# Patient Record
Sex: Male | Born: 1995 | Race: Black or African American | Hispanic: No | Marital: Single | Smoking: Never smoker
Health system: Southern US, Community
[De-identification: ages and names within clinical notes are randomized; demographics above are authoritative.]

## PROBLEM LIST (undated history)

## (undated) DIAGNOSIS — F32A Depression, unspecified: Secondary | ICD-10-CM

## (undated) DIAGNOSIS — F329 Major depressive disorder, single episode, unspecified: Secondary | ICD-10-CM

## (undated) DIAGNOSIS — F909 Attention-deficit hyperactivity disorder, unspecified type: Secondary | ICD-10-CM

## (undated) DIAGNOSIS — J45909 Unspecified asthma, uncomplicated: Secondary | ICD-10-CM

---
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
## 2016-02-23 ENCOUNTER — Emergency Department
Admission: EM | Admit: 2016-02-23 | Discharge: 2016-02-23 | Disposition: A | Discharge status: Home / Self Care | Attending: Emergency Medicine | Admitting: Emergency Medicine

## 2016-02-23 DIAGNOSIS — F1290 Cannabis use, unspecified, uncomplicated: Secondary | ICD-10-CM | POA: Diagnosis not present

## 2016-02-23 DIAGNOSIS — Y939 Activity, unspecified: Secondary | ICD-10-CM | POA: Insufficient documentation

## 2016-02-23 DIAGNOSIS — J45909 Unspecified asthma, uncomplicated: Secondary | ICD-10-CM | POA: Diagnosis not present

## 2016-02-23 DIAGNOSIS — W25XXXA Contact with sharp glass, initial encounter: Secondary | ICD-10-CM | POA: Insufficient documentation

## 2016-02-23 DIAGNOSIS — Y999 Unspecified external cause status: Secondary | ICD-10-CM | POA: Diagnosis not present

## 2016-02-23 DIAGNOSIS — S51811A Laceration without foreign body of right forearm, initial encounter: Secondary | ICD-10-CM | POA: Diagnosis not present

## 2016-02-23 DIAGNOSIS — Y929 Unspecified place or not applicable: Secondary | ICD-10-CM | POA: Diagnosis not present

## 2016-02-23 HISTORY — DX: Unspecified asthma, uncomplicated: J45.909

## 2016-02-23 MED ORDER — PENTAFLUOROPROP-TETRAFLUOROETH EX AERO: INHALATION_SPRAY | Freq: Once | CUTANEOUS | Status: DC

## 2016-02-23 MED ORDER — CEPHALEXIN 500 MG PO CAPS
500.0000 mg | ORAL_CAPSULE | Freq: Four times a day (QID) | ORAL | Status: DC
Start: 2016-02-23 — End: 2022-07-17

## 2016-02-23 NOTE — ED Notes (Signed)
Pt punched a glass window and it shattered down and cut his arm.  This happened last night.  Pt has scattered small lacerations to R fist and larger laceration in R arm.

## 2016-02-23 NOTE — Discharge Instructions (Signed)

## 2016-02-23 NOTE — ED Notes (Signed)
Lac to right forearm with glass, happened yesterday

## 2016-02-23 NOTE — ED Provider Notes (Signed)
[facility] Emergency Department Provider Note  ____________________________________________  Time seen: Approximately 8:08 PM  I have reviewed the triage vital signs and the nursing notes.   HISTORY  Chief Complaint Extremity Laceration    HPI Edwin Acevedo is a 20 y.o. male who presents emergency department for a laceration to the right forearm. Patient states that this occurred 24 hours plus ago. Patient states that he wrapped the area and was able control bleeding. He is current on his tetanus shot. Patient states that he had a glass window that shattered and fell on his arm. Patient denies any visible foreign bodies. Denies any bleeding at this time. He denies any numbness or tingling distal to the injury site.   Past Medical History  Diagnosis Date  . Asthma     There are no active problems to display for this patient.   History reviewed. No pertinent past surgical history.  Current Outpatient Rx  Name  Route  Sig  Dispense  Refill  . cephALEXin (KEFLEX) 500 MG capsule   Oral   Take 1 capsule (500 mg total) by mouth 4 (four) times daily.   28 capsule   0     Allergies Review of patient's allergies indicates no known allergies.  History reviewed. No pertinent family history.  Social History Social History  Substance Use Topics  . Smoking status: Never Smoker   . Smokeless tobacco: None  . Alcohol Use: No     Review of Systems  Constitutional: No fever/chills Cardiovascular: no chest pain. Respiratory: no cough. No SOB. Musculoskeletal: Negative for musculoskeletal pain. Skin: Positive for lacerations to the right forearm Neurological: Negative for headaches, focal weakness or numbness. 10-point ROS otherwise negative.  ____________________________________________   PHYSICAL EXAM:  VITAL SIGNS: ED Triage Vitals  Enc Vitals Group     BP --      Pulse --      Resp --      Temp --      Temp src --      SpO2 --       Weight --      Height --      Head Cir --      Peak Flow --      Pain Score 02/23/16 1954 10     Pain Loc --      Pain Edu? --      Excl. in GC? --      Constitutional: Alert and oriented. Well appearing and in no acute distress. Eyes: Conjunctivae are normal. PERRL. EOMI. Head: Atraumatic. Cardiovascular: Normal rate, regular rhythm. Normal S1 and S2.  Good peripheral circulation. Respiratory: Normal respiratory effort without tachypnea or retractions. Lungs CTAB. Good air entry to the bases with no decreased or absent breath sounds. Musculoskeletal: Full range of motion to all extremities. No gross deformities appreciated. Neurologic:  Normal speech and language. No gross focal neurologic deficits are appreciated.  Skin:  Skin is warm, dry and intact. No rash noted. Patient has multiple lacerations noted to the right anterior forearm. There is one significant laceration extending approximately 6 cm in length. Edges are smooth by gait open. Multiple small surrounding abrasions and small lacerations. These are nonbleeding. There is granulation tissue identified in the edges of the wound and on top of the abrasion. No visible foreign body with glass. Patient has good range of motion distal to injury. Good resisted range of motion. Radial pulse and cap refill intact distally. Sensation intact 5 digits  right upper extremity. Psychiatric: Mood and affect are normal. Speech and behavior are normal. Patient exhibits appropriate insight and judgement.   ____________________________________________   LABS (all labs ordered are listed, but only abnormal results are displayed)  Labs Reviewed - No data to display ____________________________________________  EKG   ____________________________________________  RADIOLOGY   No results found.  ____________________________________________    PROCEDURES  Procedure(s) performed:    The wound is cleansed, debrided of foreign material  as much as possible, and dressed. The patient is alerted to watch for any signs of infection (redness, pus, pain, increased swelling or fever) and call if such occurs. Home wound care instructions are provided. Tetanus vaccination status reviewed: tetanus re-vaccination not indicated.   Medications  pentafluoroprop-tetrafluoroeth (GEBAUERS) aerosol (not administered)     ____________________________________________   INITIAL IMPRESSION / ASSESSMENT AND PLAN / ED COURSE  Pertinent labs & imaging results that were available during my care of the patient were reviewed by me and considered in my medical decision making (see chart for details).  Patient's diagnosis is consistent with A laceration to the right forearm. This occurred gr 24 hours ago after the patient punched a glass window. There is no visible foreign bodies. There is signs of granulation tissue surrounding the most significant laceration. As such, with this occurring greater than 24 hours ago with signs of granulation that will not be closed primarily in the emergency department. This is explained to the patient and the mother. Patient verbalizes understanding but patient's mother is verbally upset that the provider will not close the wound. I have explained to the mother the complications and swelling from closure of the wound that area shows tissue granulation. Mother remains upset with the provider throughout the encounter. Area is cleansed using surgical scrub after being anesthetized using topical anesthetic. Area is then covered using nonadherent dressing covered with gauze and wrapped with Ace bandage. Wound care instructions are given to the patient. Patient will be placed prophylactically on antibiotics due to nature of multiple lacerations that have remained open and untreated greater than 24 hours.. Patient will be discharged home with prescriptions for antibiotics. Patient is to follow up with primary care provider as needed or  otherwise directed. Patient is given ED precautions to return to the ED for any worsening or new symptoms.     ____________________________________________  FINAL CLINICAL IMPRESSION(S) / ED DIAGNOSES  Final diagnoses:  Laceration of forearm, right, initial encounter      NEW MEDICATIONS STARTED DURING THIS VISIT:  New Prescriptions   CEPHALEXIN (KEFLEX) 500 MG CAPSULE    Take 1 capsule (500 mg total) by mouth 4 (four) times daily.        This chart was dictated using voice recognition software/Dragon. Despite best efforts to proofread, errors can occur which can change the meaning. Any change was purely unintentional.    Racheal Patches, PA-C 02/23/16 2039

## 2016-02-23 NOTE — ED Notes (Signed)
Pt discharged to home.  Family member driving.  Discharge instructions reviewed.  Verbalized understanding.  No questions or concerns at this time.  Teach back verified.  Pt in NAD.  No items left in ED.   

## 2017-07-09 ENCOUNTER — Emergency Department
Admission: EM | Admit: 2017-07-09 | Discharge: 2017-07-09 | Disposition: A | Discharge status: Home / Self Care | Attending: Emergency Medicine | Admitting: Emergency Medicine

## 2017-07-09 ENCOUNTER — Encounter: Payer: Self-pay | Admitting: Emergency Medicine

## 2017-07-09 ENCOUNTER — Emergency Department

## 2017-07-09 DIAGNOSIS — N50819 Testicular pain, unspecified: Secondary | ICD-10-CM | POA: Insufficient documentation

## 2017-07-09 DIAGNOSIS — N50812 Left testicular pain: Secondary | ICD-10-CM

## 2017-07-09 DIAGNOSIS — J45909 Unspecified asthma, uncomplicated: Secondary | ICD-10-CM | POA: Insufficient documentation

## 2017-07-09 HISTORY — DX: Attention-deficit hyperactivity disorder, unspecified type: F90.9

## 2017-07-09 HISTORY — DX: Depression, unspecified: F32.A

## 2017-07-09 HISTORY — DX: Major depressive disorder, single episode, unspecified: F32.9

## 2017-07-09 LAB — COMPREHENSIVE METABOLIC PANEL
ALK PHOS: 26 U/L — AB (ref 38–126)
ALT: 34 U/L (ref 17–63)
ANION GAP: 11 (ref 5–15)
AST: 30 U/L (ref 15–41)
Albumin: 4.6 g/dL (ref 3.5–5.0)
BUN: 16 mg/dL (ref 6–20)
CALCIUM: 9.6 mg/dL (ref 8.9–10.3)
CO2: 25 mmol/L (ref 22–32)
Chloride: 104 mmol/L (ref 101–111)
Creatinine, Ser: 1.25 mg/dL — ABNORMAL HIGH (ref 0.61–1.24)
GFR calc non Af Amer: >60 mL/min (ref >60)
Glucose, Bld: 114 mg/dL — ABNORMAL HIGH (ref 65–99)
Potassium: 3.6 mmol/L (ref 3.5–5.1)
SODIUM: 140 mmol/L (ref 135–145)
TOTAL PROTEIN: 7.6 g/dL (ref 6.5–8.1)
Total Bilirubin: 1.2 mg/dL (ref 0.3–1.2)

## 2017-07-09 LAB — URINALYSIS, COMPLETE (UACMP) WITH MICROSCOPIC
Bilirubin Urine: NEGATIVE
Glucose, UA: NEGATIVE mg/dL
Hgb urine dipstick: NEGATIVE
Ketones, ur: NEGATIVE mg/dL
Leukocytes, UA: NEGATIVE
Nitrite: NEGATIVE
Protein, ur: 100 mg/dL — AB
Specific Gravity, Urine: 1.015 (ref 1.005–1.030)
pH: 8 (ref 5.0–8.0)

## 2017-07-09 LAB — CBC WITH DIFFERENTIAL/PLATELET
BASOS ABS: 0 10*3/uL (ref 0–0.1)
BASOS PCT: 0 %
Eosinophils Absolute: 0.2 10*3/uL (ref 0–0.7)
Eosinophils Relative: 3 %
HEMATOCRIT: 41.2 % (ref 40.0–52.0)
HEMOGLOBIN: 14.5 g/dL (ref 13.0–18.0)
Lymphocytes Relative: 25 %
Lymphs Abs: 1.6 10*3/uL (ref 1.0–3.6)
MCH: 28.6 pg (ref 26.0–34.0)
MCHC: 35.2 g/dL (ref 32.0–36.0)
MCV: 81.2 fL (ref 80.0–100.0)
MONOS PCT: 6 %
Monocytes Absolute: 0.4 10*3/uL (ref 0.2–1.0)
NEUTROS PCT: 66 %
Neutro Abs: 4.2 10*3/uL (ref 1.4–6.5)
Platelets: 229 10*3/uL (ref 150–440)
RBC: 5.07 MIL/uL (ref 4.40–5.90)
RDW: 13.1 % (ref 11.5–14.5)
WBC: 6.4 10*3/uL (ref 3.8–10.6)

## 2017-07-09 LAB — CHLAMYDIA/NGC RT PCR (ARMC ONLY)
Chlamydia Tr: NOT DETECTED
N gonorrhoeae: NOT DETECTED

## 2017-07-09 MED ORDER — LEVOFLOXACIN 500 MG PO TABS: 500.0000 mg | ORAL_TABLET | Freq: Every day | ORAL | 0 refills | Status: DC

## 2017-07-09 MED ORDER — HYDROMORPHONE HCL 1 MG/ML IJ SOLN
1.0000 mg | Freq: Once | INTRAMUSCULAR | Status: AC
  Administered 2017-07-09: 1 mg via INTRAVENOUS
  Filled 2017-07-09: qty 1

## 2017-07-09 MED ORDER — DEXTROSE 5 % IV SOLN
250.0000 mg | Freq: Once | INTRAVENOUS | Status: AC
  Administered 2017-07-09: 250 mg via INTRAVENOUS
  Filled 2017-07-09: qty 250

## 2017-07-09 MED ORDER — OXYCODONE-ACETAMINOPHEN 5-325 MG PO TABS: 1.0000 | ORAL_TABLET | Freq: Four times a day (QID) | ORAL | 0 refills | Status: AC | PRN

## 2017-07-09 MED ORDER — AZITHROMYCIN 500 MG PO TABS
1000.0000 mg | ORAL_TABLET | Freq: Once | ORAL | Status: AC
  Administered 2017-07-09: 1000 mg via ORAL
  Filled 2017-07-09: qty 2

## 2017-07-09 NOTE — ED Triage Notes (Signed)
Patient to ED in custody of [facility]. Patient reports left testicular pain that started this morning when he woke up to use the restroom. Patient denies injury or history of same.

## 2017-07-09 NOTE — ED Provider Notes (Addendum)
[facility]  ____________________________________________   I have reviewed the triage vital signs and the nursing notes.   HISTORY  Chief Complaint Testicle Pain    HPI Edwin Acevedo is a 21 y.o. male Who presents today complaining of left testicular pain, started suddenly woke him up from sleep he thinks perhaps at 4:00 in the morning. Patient is incarcerated. He states that he has had no trauma to the area, he denies any dysuria or antecedent urinary symptoms. He states he just woke up with severe left testicular pain. he hasnever had this before. I saw the patient immediately upon arrival to room.nothing makes it better, moving makes it worse, no antecedent signs or symptoms no antecedent treatment, pain is sharp and nonradiating.    Past Medical History:  Diagnosis Date  . ADHD   . Asthma   . Depression     There are no active problems to display for this patient.   History reviewed. No pertinent surgical history.  Prior to Admission medications   Medication Sig Start Date End Date Taking? Authorizing Provider  cephALEXin (KEFLEX) 500 MG capsule Take 1 capsule (500 mg total) by mouth 4 (four) times daily. 02/23/16   Cuthriell, Delorise Royals, PA-C    Allergies Patient has no known allergies.  No family history on file.  Social History Social History  Substance Use Topics  . Smoking status: Never Smoker  . Smokeless tobacco: Not on file  . Alcohol use No    Review of Systems Constitutional: No fever/chills Eyes: No visual changes. ENT: No sore throat. No stiff neck no neck pain Cardiovascular: Denies chest pain. Respiratory: Denies shortness of breath. Gastrointestinal:   no vomiting.  No diarrhea.  No constipation. Genitourinary: Negative for dysuria. Musculoskeletal: Negative lower extremity swelling Skin: Negative for rash. Neurological: Negative for severe headaches, focal weakness or  numbness.   ____________________________________________   PHYSICAL EXAM:  VITAL SIGNS: ED Triage Vitals [07/09/17 0735]  Enc Vitals Group     BP (!) 157/96     Pulse Rate 84     Resp (!) 24     Temp 97.8 F (36.6 C)     Temp Source Oral     SpO2 100 %     Weight 180 lb (81.6 kg)     Height  (1.778 m)     Head Circumference      Peak Flow      Pain Score 10     Pain Loc      Pain Edu?      Excl. in GC?     Constitutional: Alert and oriented. Well appearing appears very uncomfortable. Eyes: Conjunctivae are normal Head: Atraumatic HEENT: No congestion/rhinnorhea. Mucous membranes are moist.  Oropharynx non-erythematous Neck:   Nontender with no meningismus, no masses, no stridor Cardiovascular: Normal rate, regular rhythm. Grossly normal heart sounds.  Good peripheral circulation. Respiratory: Normal respiratory effort.  No retractions. Lungs CTAB. Abdominal: Soft and nontender. No distention. No guarding no rebound Back:  There is no focal tenderness or step off.  there is no midline tenderness there are no lesions noted. there is no CVA tenderness GU: There is something of a transverse lie to the left testicle, which is tender to touch no significant erythema or swelling, cremasteric reflexes diminished bilaterally, there is tenderness palpation but no significant swelling noted to the left testicular region. Penis itself is normal appearance. No obvious lesions. No masses. Testicle itself in normal  size. No significant swelling.  Musculoskeletal: No lower extremity tenderness, no upper extremity tenderness. No joint effusions, no DVT signs strong distal pulses no edema Neurologic:  Normal speech and language. No gross focal neurologic deficits are appreciated.  Skin:  Skin is warm, dry and intact. No rash noted. Psychiatric: Mood and affect are normal. Speech and behavior are normal.  ____________________________________________   LABS (all labs ordered are  listed, but only abnormal results are displayed)  Labs Reviewed  CHLAMYDIA/NGC RT PCR ([facility] ONLY)  COMPREHENSIVE METABOLIC PANEL  CBC WITH DIFFERENTIAL/PLATELET  URINALYSIS, COMPLETE ([facility]MP) WITH MICROSCOPIC    Pertinent labs  results that were available during my care of the patient were reviewed by me and considered in my medical decision making (see chart for details). ____________________________________________  EKG  I personally interpreted any EKGs ordered by me or triage  ____________________________________________  RADIOLOGY  Pertinent labs & imaging results that were available during my care of the patient were reviewed by me and considered in my medical decision making (see chart for details). If possible, patient and/or family made aware of any abnormal findings. ____________________________________________    PROCEDURES  Procedure(s) performed: None  Procedures  Critical Care performed: None  ____________________________________________   INITIAL IMPRESSION / ASSESSMENT AND PLAN / ED COURSE  Pertinent labs & imaging results that were available during my care of the patient were reviewed by me and considered in my medical decision making (see chart for details).  patient here with sudden onset testicular pain differential includes torsion, epididymitis, torsion of the appendix, STI, etc. We will give the patient pain medication. given that he is already almost 4 hours into this, by his best estimate although he is not exactly sure when this started, I have called ultrasound to see if we can have a stat testicular ultrasound performed. I also did try manual detorsion exercises which did not seem to change patient's presentation.  Clinical Course as of Jul 09 917  Tue Jul 09, 2017  1610 after pain medication patient very much more comfortable, able to do a better exam, he has minimal tenderness to palpation left testicle, the testicular lie appears to  be more normal, he has cremasteric reflexes which are now elicitable, and she is much less tender. Either the detorsion worked or there is other pathology present. We will await ultrasound results which I have called for, and patient is much more comfortable  [JM]    Clinical Course User Index [JM] Jeanmarie Plant, MD   __----------------------------------------- 9:18 AM on 07/09/2017 -----------------------------------------  Pt comfortable and slight tenderness only noted. I have paged urology.  ----------------------------------------- 9:33 AM on 07/09/2017 ----------------------------------------- D/w Dr. Mena Goes, we discussed patient's initial presentation, blood work, lab work, urinalysis, ultrasound readings, current exam, and history etc. He feels patient is safe for outpatient discharge, patient himself feels much better. I will empirically treated for possible epididymitis with Rocephin and levoflox, is possibly torsion detorsion although the absence of any evidence ofedema despite 4 hours of pain prior to ultrasound suggested this was most likely not a torsion. Urology agrees. We will send the patient home with antibiotics and pain control and close outpatient follow-up and return precautions. as he is in jail, I have also try to teach him how to do a manual detorsion should this happen again because obviously it would be somewhat difficult for him possibly to control how rapidly he comes back.  __________________________________________   FINAL CLINICAL IMPRESSION(S) / ED DIAGNOSES  Final diagnoses:  None  This chart was dictated using voice recognition software.  Despite best efforts to proofread,  errors can occur which can change meaning.      Jeanmarie Plant, MD 07/09/17 1610    Jeanmarie Plant, MD 07/09/17 9604    Jeanmarie Plant, MD 07/09/17 5409    Jeanmarie Plant, MD 07/09/17 8119    Jeanmarie Plant, MD 07/09/17 1478    Jeanmarie Plant,  MD 07/09/17 1017

## 2017-07-09 NOTE — ED Notes (Signed)
Patient taken to sheriff's car in wheelchair. Patient verbalized understanding of discharge instructions.

## 2017-07-09 NOTE — Discharge Instructions (Signed)
It is not clear exactly what caused your pain today. If you have pain that comes back, fever, vomiting, burning when you urinate return to the emergency department. If you ever have sudden onset pain that comes in one testicle it could be a sign you have twisted your testicle and you need to come right away. You may have less than four hours to get it corrected.

## 2017-07-09 NOTE — ED Notes (Signed)
Patient remains in bilateral wrist cuffs and ankle shackles. Good pulses and skin condition noted. Even respirations and no apparent distress.

## 2017-07-10 LAB — URINE CULTURE: Culture: NO GROWTH

## 2018-11-14 IMAGING — US US SCROTUM
1 series · 14 of 25 positions shown · non-contrast
Comparison: None.

CLINICAL DATA: Left-sided testicular pain since 4 a.m..

EXAM:
SCROTAL ULTRASOUND
DOPPLER ULTRASOUND OF THE TESTICLES
TECHNIQUE: Complete ultrasound examination of the testicles, epididymis, and
other scrotal structures was performed. Color and spectral Doppler
ultrasound were also utilized to evaluate blood flow to the
testicles.

[Series 1: us scrotum · 0.08mm/px · 14 of 89 slices shown]
[im 1/89]
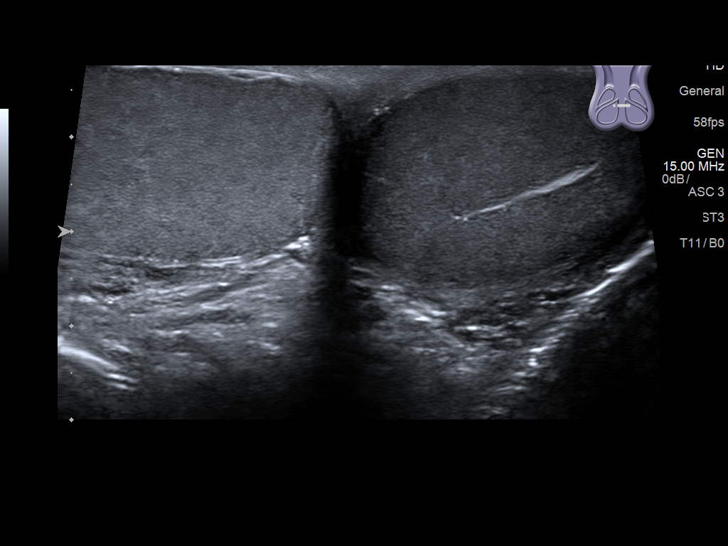
[im 8/89]
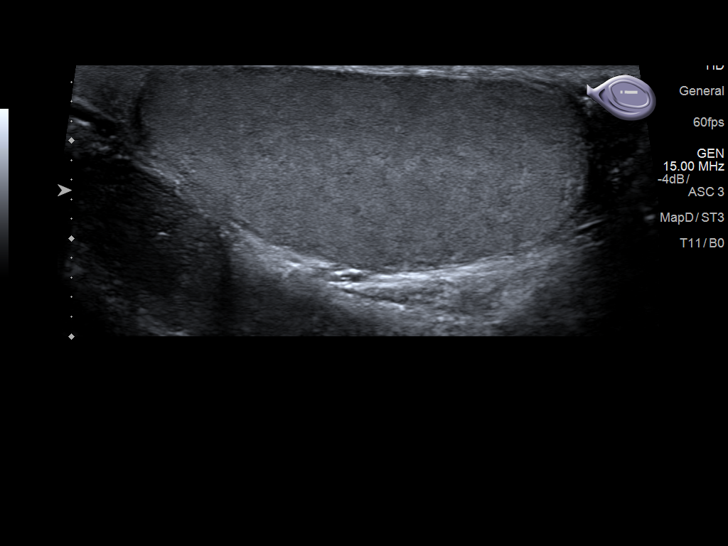
[im 15/89]
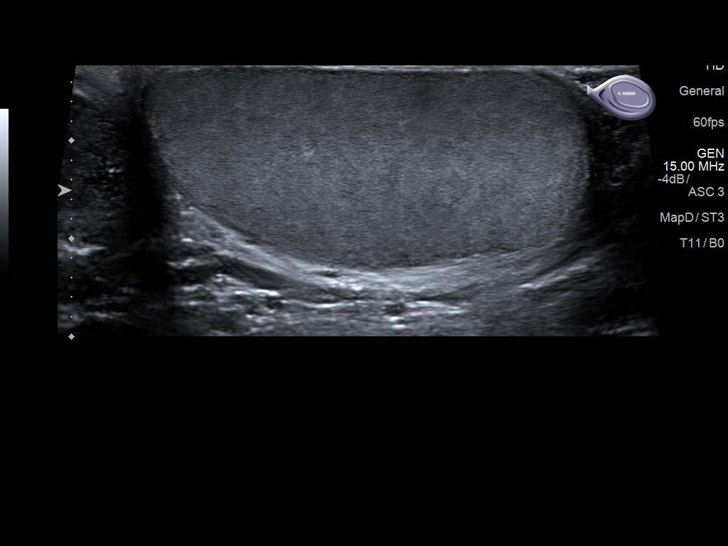
[im 23/89]
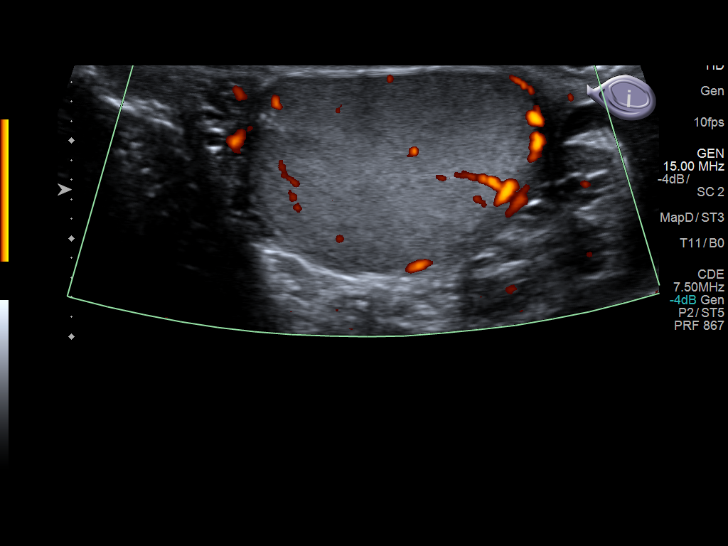
[im 30/89]
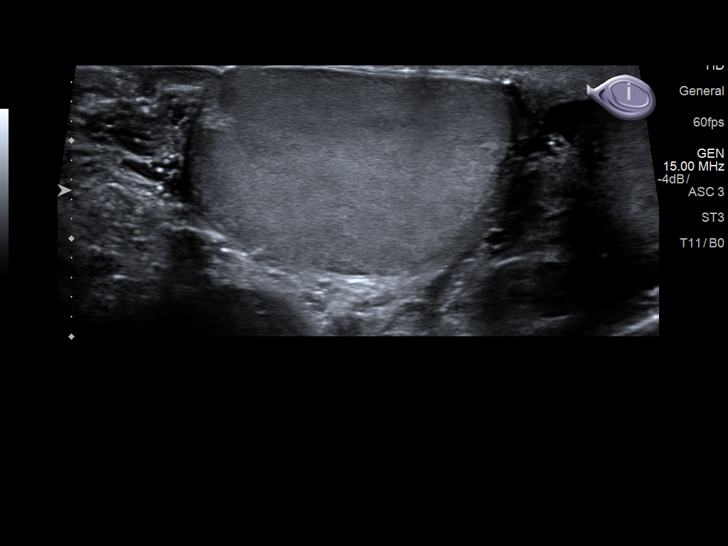
[im 34/89]
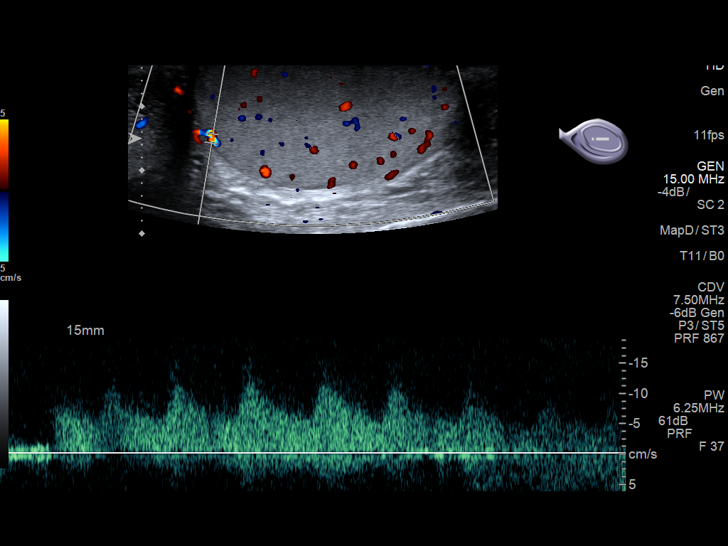
[im 41/89]
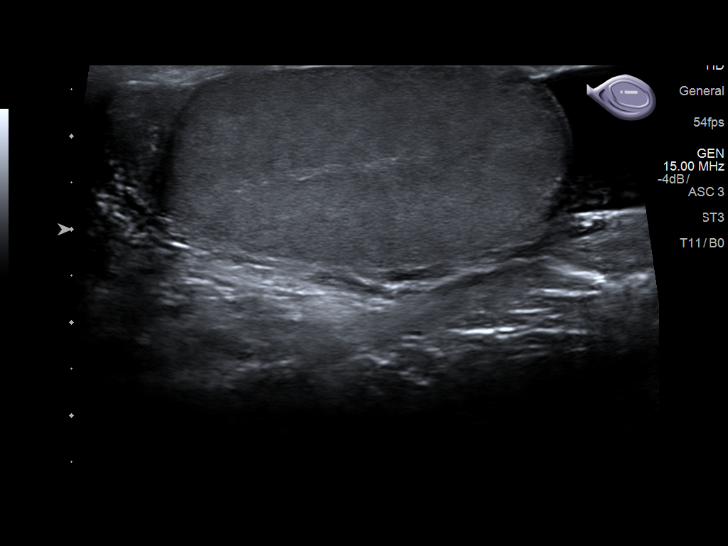
[im 48/89]
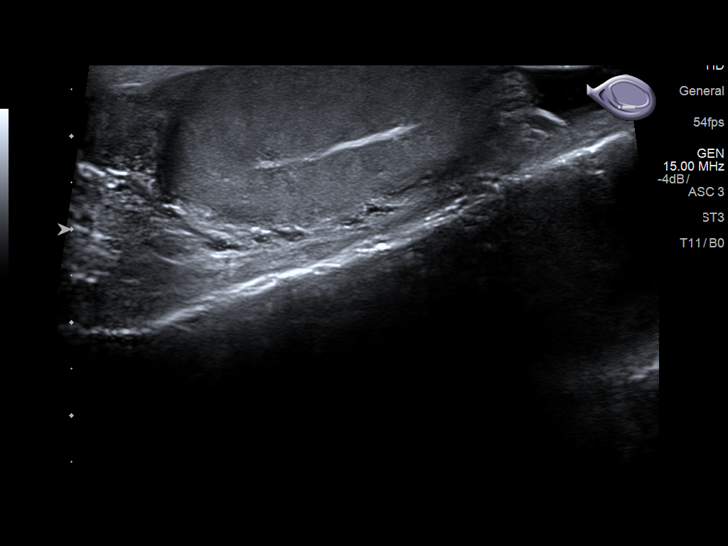
[im 56/89]
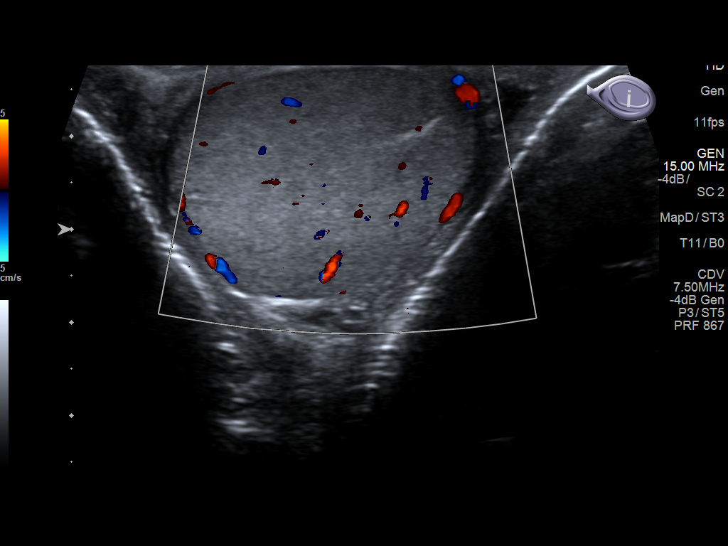
[im 59/89]
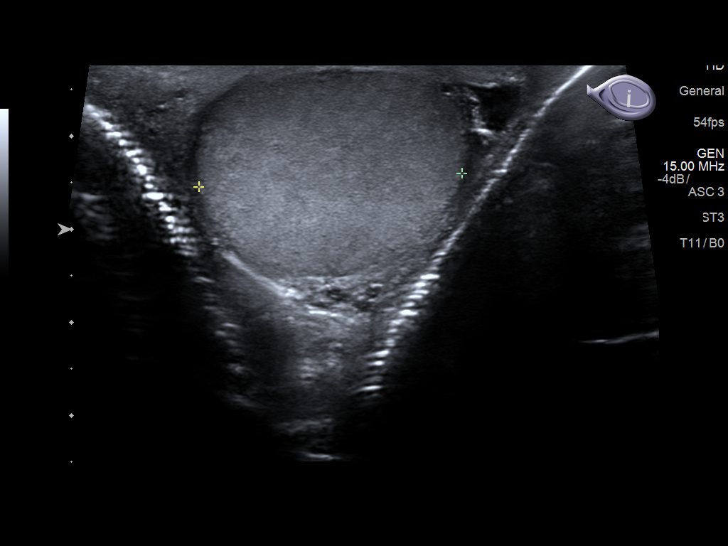
[im 67/89]
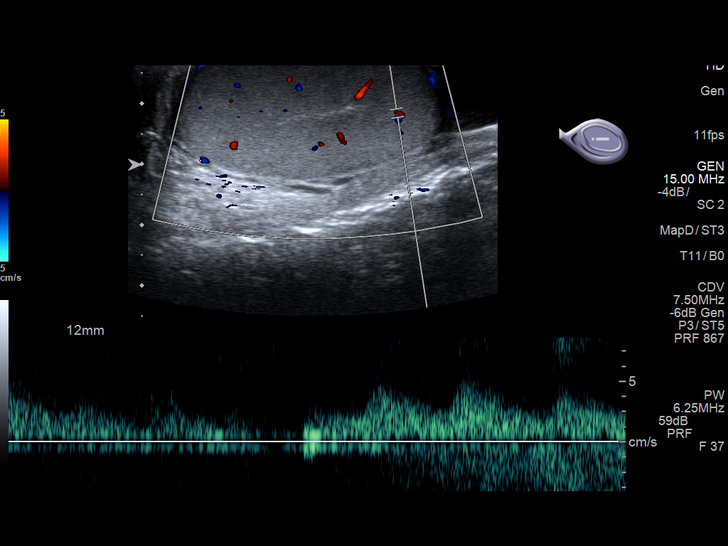
[im 74/89]
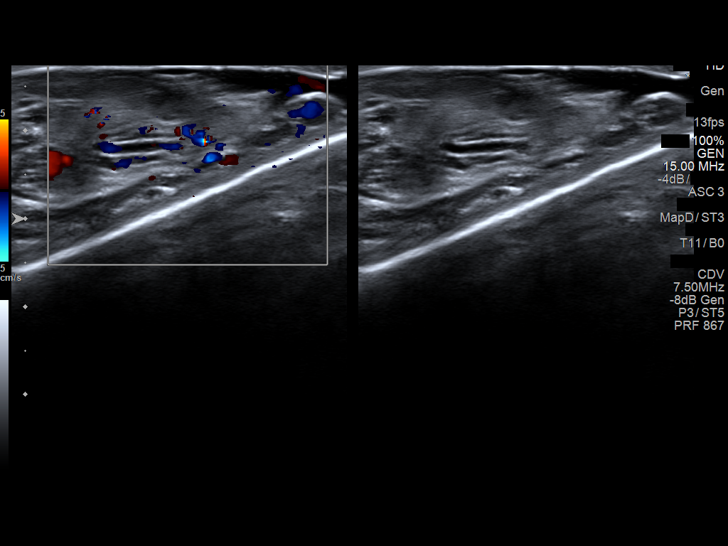
[im 81/89]
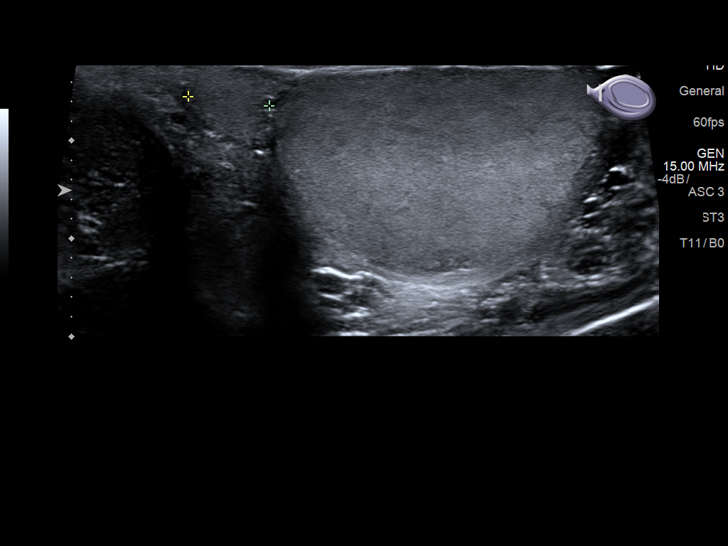
[im 89/89]
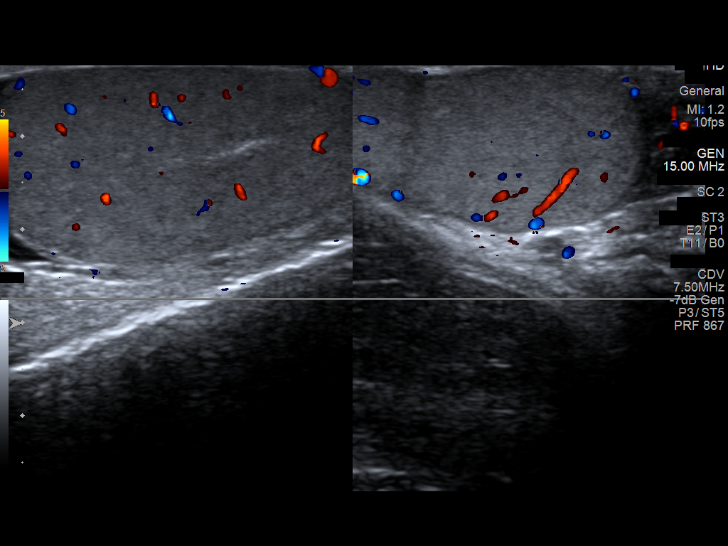

[14 of 25 positions shown; findings below may reference images not displayed]

FINDINGS: Right testicle

Measurements: 4.6 x 1.9 x 2.9 cm. No mass or microlithiasis
visualized.

Left testicle

Measurements: 4.5 x 2.3 x 2.8 cm. No mass or microlithiasis
visualized.

Right epididymis:  Normal in size and appearance.

Left epididymis:  Normal in size and appearance.

Hydrocele:  Small to moderate left-sided hydrocele.

Varicocele:  None visualized.

Pulsed Doppler interrogation of both testes demonstrates normal low
resistance arterial and venous waveforms bilaterally.
IMPRESSION: 1. No acute process or explanation for left-sided pain. No evidence
of orchitis or testicular torsion.
2. Left hydrocele.

## 2022-07-17 ENCOUNTER — Ambulatory Visit
Admission: EM | Admit: 2022-07-17 | Discharge: 2022-07-17 | Disposition: A | Discharge status: Home / Self Care | Payer: Self-pay | Attending: Emergency Medicine | Admitting: Emergency Medicine

## 2022-07-17 ENCOUNTER — Emergency Department: Payer: Self-pay | Admitting: Anesthesiology

## 2022-07-17 ENCOUNTER — Encounter: Payer: Self-pay | Admitting: Emergency Medicine

## 2022-07-17 ENCOUNTER — Encounter
Admission: EM | Disposition: A | Discharge status: Home / Self Care | Payer: Self-pay | Source: Home / Self Care | Attending: Emergency Medicine

## 2022-07-17 ENCOUNTER — Emergency Department: Payer: Self-pay

## 2022-07-17 DIAGNOSIS — N4400 Torsion of testis, unspecified: Secondary | ICD-10-CM | POA: Insufficient documentation

## 2022-07-17 HISTORY — PX: ORCHIOPEXY: SHX479

## 2022-07-17 LAB — CBC WITH DIFFERENTIAL/PLATELET
Abs Immature Granulocytes: 0.01 10*3/uL (ref 0.00–0.07)
Basophils Absolute: 0 10*3/uL (ref 0.0–0.1)
Basophils Relative: 1 %
Eosinophils Absolute: 0.5 10*3/uL (ref 0.0–0.5)
Eosinophils Relative: 8 %
HCT: 47.9 % (ref 39.0–52.0)
Hemoglobin: 15.9 g/dL (ref 13.0–17.0)
Immature Granulocytes: 0 %
Lymphocytes Relative: 51 %
Lymphs Abs: 3 10*3/uL (ref 0.7–4.0)
MCH: 28 pg (ref 26.0–34.0)
MCHC: 33.2 g/dL (ref 30.0–36.0)
MCV: 84.3 fL (ref 80.0–100.0)
Monocytes Absolute: 0.4 10*3/uL (ref 0.1–1.0)
Monocytes Relative: 6 %
Neutro Abs: 2 10*3/uL (ref 1.7–7.7)
Neutrophils Relative %: 34 %
Platelets: 290 10*3/uL (ref 150–400)
RBC: 5.68 MIL/uL (ref 4.22–5.81)
RDW: 12.2 % (ref 11.5–15.5)
WBC: 5.9 10*3/uL (ref 4.0–10.5)
nRBC: 0 % (ref 0.0–0.2)

## 2022-07-17 LAB — URINALYSIS, ROUTINE W REFLEX MICROSCOPIC
Bilirubin Urine: NEGATIVE
Glucose, UA: NEGATIVE mg/dL
Hgb urine dipstick: NEGATIVE
Ketones, ur: NEGATIVE mg/dL
Leukocytes,Ua: NEGATIVE
Nitrite: NEGATIVE
Protein, ur: NEGATIVE mg/dL
Specific Gravity, Urine: 1.018 (ref 1.005–1.030)
pH: 7 (ref 5.0–8.0)

## 2022-07-17 LAB — BASIC METABOLIC PANEL
Anion gap: 8 (ref 5–15)
BUN: 13 mg/dL (ref 6–20)
CO2: 28 mmol/L (ref 22–32)
Calcium: 9.6 mg/dL (ref 8.9–10.3)
Chloride: 102 mmol/L (ref 98–111)
Creatinine, Ser: 1.16 mg/dL (ref 0.61–1.24)
GFR, Estimated: >60 mL/min (ref >60)
Glucose, Bld: 107 mg/dL — ABNORMAL HIGH (ref 70–99)
Potassium: 3.4 mmol/L — ABNORMAL LOW (ref 3.5–5.1)
Sodium: 138 mmol/L (ref 135–145)

## 2022-07-17 LAB — CHLAMYDIA/NGC RT PCR (ARMC ONLY)
Chlamydia Tr: NOT DETECTED
N gonorrhoeae: NOT DETECTED

## 2022-07-17 SURGERY — ORCHIOPEXY ADULT
Anesthesia: General | Site: Scrotum | Laterality: Bilateral

## 2022-07-17 MED ORDER — SODIUM CHLORIDE 0.9 % IV SOLN: INTRAVENOUS | Status: DC | PRN

## 2022-07-17 MED ORDER — MIDAZOLAM HCL 2 MG/2ML IJ SOLN
INTRAMUSCULAR | Status: AC
  Filled 2022-07-17: qty 2

## 2022-07-17 MED ORDER — SODIUM CHLORIDE 0.9 % IV BOLUS (SEPSIS)
1000.0000 mL | Freq: Once | INTRAVENOUS | Status: AC
  Administered 2022-07-17: 1000 mL via INTRAVENOUS

## 2022-07-17 MED ORDER — FENTANYL CITRATE (PF) 100 MCG/2ML IJ SOLN
INTRAMUSCULAR | Status: DC | PRN
  Administered 2022-07-17: 100 ug via INTRAVENOUS

## 2022-07-17 MED ORDER — CEFAZOLIN SODIUM-DEXTROSE 2-4 GM/100ML-% IV SOLN
INTRAVENOUS | Status: AC
  Filled 2022-07-17: qty 100

## 2022-07-17 MED ORDER — HYDROCODONE-ACETAMINOPHEN 5-325 MG PO TABS: 1.0000 | ORAL_TABLET | Freq: Four times a day (QID) | ORAL | 0 refills | Status: AC | PRN

## 2022-07-17 MED ORDER — MIDAZOLAM HCL 2 MG/2ML IJ SOLN
INTRAMUSCULAR | Status: DC | PRN
  Administered 2022-07-17: 2 mg via INTRAVENOUS

## 2022-07-17 MED ORDER — FENTANYL CITRATE (PF) 100 MCG/2ML IJ SOLN
INTRAMUSCULAR | Status: AC
  Filled 2022-07-17: qty 2

## 2022-07-17 MED ORDER — OXYCODONE HCL 5 MG PO TABS
ORAL_TABLET | ORAL | Status: AC
  Filled 2022-07-17: qty 1

## 2022-07-17 MED ORDER — ROCURONIUM BROMIDE 100 MG/10ML IV SOLN
INTRAVENOUS | Status: DC | PRN
  Administered 2022-07-17: 30 mg via INTRAVENOUS

## 2022-07-17 MED ORDER — 0.9 % SODIUM CHLORIDE (POUR BTL) OPTIME
TOPICAL | Status: DC | PRN
  Administered 2022-07-17: 150 mL

## 2022-07-17 MED ORDER — PROPOFOL 10 MG/ML IV BOLUS
INTRAVENOUS | Status: AC
  Filled 2022-07-17: qty 20

## 2022-07-17 MED ORDER — FENTANYL CITRATE (PF) 100 MCG/2ML IJ SOLN: 25.0000 ug | INTRAMUSCULAR | Status: DC | PRN

## 2022-07-17 MED ORDER — PROPOFOL 10 MG/ML IV BOLUS
INTRAVENOUS | Status: DC | PRN
  Administered 2022-07-17: 200 mg via INTRAVENOUS

## 2022-07-17 MED ORDER — DEXAMETHASONE SODIUM PHOSPHATE 10 MG/ML IJ SOLN
INTRAMUSCULAR | Status: DC | PRN
  Administered 2022-07-17: 10 mg via INTRAVENOUS

## 2022-07-17 MED ORDER — BUPIVACAINE HCL 0.5 % IJ SOLN
INTRAMUSCULAR | Status: DC | PRN
  Administered 2022-07-17: 10 mL

## 2022-07-17 MED ORDER — DEXMEDETOMIDINE HCL IN NACL 200 MCG/50ML IV SOLN
INTRAVENOUS | Status: DC | PRN
  Administered 2022-07-17: 4 ug via INTRAVENOUS
  Administered 2022-07-17: 8 ug via INTRAVENOUS

## 2022-07-17 MED ORDER — ONDANSETRON HCL 4 MG/2ML IJ SOLN
INTRAMUSCULAR | Status: DC | PRN
  Administered 2022-07-17: 4 mg via INTRAVENOUS

## 2022-07-17 MED ORDER — HYDROMORPHONE HCL 1 MG/ML IJ SOLN
1.0000 mg | Freq: Once | INTRAMUSCULAR | Status: AC
  Administered 2022-07-17: 1 mg via INTRAVENOUS
  Filled 2022-07-17: qty 1

## 2022-07-17 MED ORDER — CEFAZOLIN SODIUM-DEXTROSE 2-4 GM/100ML-% IV SOLN
2.0000 g | Freq: Once | INTRAVENOUS | Status: AC
  Administered 2022-07-17: 2 g via INTRAVENOUS

## 2022-07-17 MED ORDER — ONDANSETRON HCL 4 MG/2ML IJ SOLN
4.0000 mg | Freq: Once | INTRAMUSCULAR | Status: AC
  Administered 2022-07-17: 4 mg via INTRAVENOUS
  Filled 2022-07-17: qty 2

## 2022-07-17 MED ORDER — KETAMINE HCL 50 MG/5ML IJ SOSY
PREFILLED_SYRINGE | INTRAMUSCULAR | Status: AC
  Filled 2022-07-17: qty 5

## 2022-07-17 MED ORDER — OXYCODONE HCL 5 MG PO TABS
5.0000 mg | ORAL_TABLET | Freq: Once | ORAL | Status: AC
  Administered 2022-07-17: 5 mg via ORAL

## 2022-07-17 MED ORDER — PROMETHAZINE HCL 25 MG/ML IJ SOLN: 6.2500 mg | INTRAMUSCULAR | Status: DC | PRN

## 2022-07-17 MED ORDER — SUCCINYLCHOLINE CHLORIDE 200 MG/10ML IV SOSY
PREFILLED_SYRINGE | INTRAVENOUS | Status: DC | PRN
  Administered 2022-07-17: 120 mg via INTRAVENOUS

## 2022-07-17 MED ORDER — SUGAMMADEX SODIUM 200 MG/2ML IV SOLN
INTRAVENOUS | Status: DC | PRN
  Administered 2022-07-17: 225 mg via INTRAVENOUS

## 2022-07-17 SURGICAL SUPPLY — 32 items
BLADE SURG 15 STRL LF DISP TIS (BLADE) ×1 IMPLANT
BLADE SURG 15 STRL SS (BLADE) ×1
CHLORAPREP W/TINT 26 (MISCELLANEOUS) ×1 IMPLANT
DRAPE LAPAROTOMY 77X122 PED (DRAPES) ×1 IMPLANT
DRSG GAUZE FLUFF 36X18 (GAUZE/BANDAGES/DRESSINGS) ×1 IMPLANT
ELECT REM PT RETURN 9FT ADLT (ELECTROSURGICAL) ×1
ELECTRODE REM PT RTRN 9FT ADLT (ELECTROSURGICAL) ×1 IMPLANT
GAUZE 4X4 16PLY ~~LOC~~+RFID DBL (SPONGE) ×1 IMPLANT
GEL ULTRASOUND 20GR AQUASONIC (MISCELLANEOUS) IMPLANT
GLOVE BIO SURGEON STRL SZ 6.5 (GLOVE) ×1 IMPLANT
GOWN STRL REUS W/ TWL LRG LVL3 (GOWN DISPOSABLE) ×2 IMPLANT
GOWN STRL REUS W/TWL LRG LVL3 (GOWN DISPOSABLE) ×2
KIT TURNOVER KIT A (KITS) ×1 IMPLANT
MANIFOLD NEPTUNE II (INSTRUMENTS) ×1 IMPLANT
NDL HYPO 25X1 1.5 SAFETY (NEEDLE) ×1 IMPLANT
NEEDLE HYPO 25X1 1.5 SAFETY (NEEDLE) ×1 IMPLANT
NS IRRIG 500ML POUR BTL (IV SOLUTION) ×1 IMPLANT
PACK BASIN MINOR ARMC (MISCELLANEOUS) ×1 IMPLANT
SOL PREP PVP 2OZ (MISCELLANEOUS) ×1
SOLUTION PREP PVP 2OZ (MISCELLANEOUS) ×1 IMPLANT
SPONGE KITTNER 5P (MISCELLANEOUS) ×1 IMPLANT
SUPPORETR ATHLETIC LG (MISCELLANEOUS) ×1 IMPLANT
SUPPORTER ATHLETIC LG (MISCELLANEOUS) ×1
SUT CHROMIC 4 0 PS 2 18 (SUTURE) ×1 IMPLANT
SUT MON AB 3-0 SH 27 (SUTURE) ×1 IMPLANT
SUT PROLENE 4 0 RB 1 (SUTURE) ×3
SUT PROLENE 4-0 RB1 .5 CRCL 36 (SUTURE) ×1 IMPLANT
SUT VIC AB 4-0 SH 27 (SUTURE) ×1
SUT VIC AB 4-0 SH 27XANBCTRL (SUTURE) ×1 IMPLANT
SYR 10ML LL (SYRINGE) ×1 IMPLANT
TRAP FLUID SMOKE EVACUATOR (MISCELLANEOUS) ×1 IMPLANT
WATER STERILE IRR 500ML POUR (IV SOLUTION) ×1 IMPLANT

## 2022-07-17 NOTE — H&P (Signed)
Chief Complaint:  History of Present Illness: Edwin Acevedo is a 26 y.o. year old male presenting with left testicular torsion.  He reports that around 10 PM this evening, he started to have acute onset left testicular pain with associated abdominal pain.  The pain is not subsided.  He eventually presented to the emergency room.  In the emergency room, Doppler ultrasound confirmed lack of flow within the left testicle consistent with testicular torsion.  He does have a personal history of severe left testicular pain.  He presented in 2018 with a very similar episode although his pain subsided and ultrasound at that time showed restoration of blood flow consistent with possible torsion detorsion event.  He is also treated for possible presumed epididymitis as part of the differential diagnosis.  Past Medical History:  Diagnosis Date   ADHD    Asthma    Depression     History reviewed. No pertinent surgical history.  Home Medications:  No outpatient medications have been marked as taking for the 07/17/22 encounter [facility] Encounter).    Allergies: No Known Allergies  History reviewed. No pertinent family history.  Social History:  reports that he has never smoked. He does not have any smokeless tobacco history on file. He reports current drug use. Drug: Marijuana. He reports that he does not drink alcohol.  ROS: A complete review of systems was performed.  All systems are negative except for pertinent findings as noted.  Physical Exam:  Vital signs in last 24 hours: Temp:  [98.1 F (36.7 C)] 98.1 F (36.7 C) (10/17 0211) Pulse Rate:  [69] 69 (10/17 0211) Resp:  [22] 22 (10/17 0211) BP: (174)/(110) 174/110 (10/17 0211) SpO2:  [100 %] 100 % (10/17 0211) Constitutional:  Alert and oriented, No acute distress HEENT: [place] AT, moist mucus membranes.  Trachea midline, no masses Cardiovascular: Regular rate and rhythm, no clubbing, cyanosis, or edema. Respiratory: Normal  respiratory effort, lungs clear bilaterally GI: Abdomen is soft, nontender, nondistended, no abdominal masses YB:OFBP riding tender left testicle Neurologic: Grossly intact, no focal deficits, moving all 4 extremities Psychiatric: Normal mood and affect   Radiologic Imaging: US SCROTUM W/DOPPLER  Result Date: 07/17/2022 CLINICAL DATA:  Testicular pain on the left EXAM: SCROTAL ULTRASOUND DOPPLER ULTRASOUND OF THE TESTICLES TECHNIQUE: Complete ultrasound examination of the testicles, epididymis, and other scrotal structures was performed. Color and spectral Doppler ultrasound were also utilized to evaluate blood flow to the testicles. COMPARISON:  Scrotal ultrasound 07/09/2017 FINDINGS: Right testicle Measurements: 4.2 x 2.5 x 2.8 cm. No mass or microlithiasis visualized. Left testicle Measurements: 4.2 x 3.2 x 2.7 cm. No mass or microlithiasis visualized. Right epididymis:  Normal in size and appearance. Left epididymis:  Normal in size and appearance. Hydrocele:  Small left hydrocele. Varicocele:  None visualized. Pulsed Doppler interrogation of both testes demonstrates no Doppler flow within the left testicle. Normal low resistance arterial and venous waveforms in the right testicle. IMPRESSION: Left testicular torsion. These results were called by telephone at the time of interpretation on 07/17/2022 at 3:29 am to provider [facility] , who verbally acknowledged these results. Electronically Signed   By: Minerva Fester M.D.   On: 07/17/2022 03:29    Personally reviewed the above study  Impression/Plan:  1.  Acute left testicular torsion-clinical presentation and Doppler ultrasound confirmed the presence of no flow in the left testicle.  I recommended urgent scrotal exploration, orchidopexy and possible left orchiectomy.  Symptoms have been present for  greater than 6 hours at this point and he understands that the risk of testicular loss however if any blood flow is restored, we will opt to try  to salvage the testicle.  We discussed at this will likely not impact his fertility or testosterone given his normal right testicle.  Risk of bleeding, infection, damage surrounding structures, testicular loss amongst others were discussed.  All questions were answered.  He is willing to proceed for emergency surgery as planned.  He will be discharged later this morning.  07/17/2022, 3:57 AM  Hollice Espy,  MD

## 2022-07-17 NOTE — Transfer of Care (Signed)
Immediate Anesthesia Transfer of Care Note  Patient: Edwin Acevedo  Procedure(s) Performed: ORCHIOPEXY ADULT (Bilateral: Scrotum)  Patient Location: PACU  Anesthesia Type:General  Level of Consciousness: sedated and patient cooperative  Airway & Oxygen Therapy: Patient Spontanous Breathing and Patient connected to face mask oxygen  Post-op Assessment: Report given to RN and Post -op Vital signs reviewed and stable  Post vital signs: Reviewed and stable  Last Vitals:  Vitals Value Taken Time  BP 167/85 07/17/22 0501  Temp 36.3 C 07/17/22 0500  Pulse 65 07/17/22 0503  Resp 18 07/17/22 0500  SpO2 100 % 07/17/22 0503  Vitals shown include unvalidated device data.  Last Pain:  Vitals:   07/17/22 0500  TempSrc:   PainSc: Asleep         Complications: No notable events documented.

## 2022-07-17 NOTE — ED Provider Notes (Signed)
[facility] Provider Note    Event Date/Time   First MD Initiated Contact with Patient 07/17/22 0303     (approximate)   History   Testicle Pain   HPI  Edwin Acevedo is a 26 y.o. male with history of ADHD, asthma and depression who presents to the emergency department with his mother with complaints of sudden onset left testicular pain that started at 23 PM tonight.  Denies any injury.  States it came on suddenly at rest.  No fevers, vomiting but feels nauseated.  No dysuria, penile discharge.  Has never had similar symptoms.   History provided by patient and mother.    Past Medical History:  Diagnosis Date   ADHD    Asthma    Depression     History reviewed. No pertinent surgical history.  MEDICATIONS:  Prior to Admission medications   Medication Sig Start Date End Date Taking? Authorizing Provider  cephALEXin (KEFLEX) 500 MG capsule Take 1 capsule (500 mg total) by mouth 4 (four) times daily. 02/23/16   Cuthriell, Charline Bills, PA-C  levofloxacin (LEVAQUIN) 500 MG tablet Take 1 tablet (500 mg total) by mouth daily. 07/09/17   Schuyler Amor, MD    Physical Exam   Triage Vital Signs: ED Triage Vitals [07/17/22 0211]  Enc Vitals Group     BP (!) 174/110     Pulse Rate 69     Resp (!) 22     Temp 98.1 F (36.7 C)     Temp Source Oral     SpO2 100 %     Weight      Height      Head Circumference      Peak Flow      Pain Score      Pain Loc      Pain Edu?      Excl. in Progress?     Most recent vital signs: Vitals:   07/17/22 0211  BP: (!) 174/110  Pulse: 69  Resp: (!) 22  Temp: 98.1 F (36.7 C)  SpO2: 100%    CONSTITUTIONAL: Alert and oriented and responds appropriately to questions.  Appears extremely uncomfortable HEAD: Normocephalic, atraumatic EYES: Conjunctivae clear, pupils appear equal, sclera nonicteric ENT: normal nose; moist mucous membranes NECK: Supple, normal ROM CARD: RRR; S1 and S2 appreciated; no murmurs, no  clicks, no rubs, no gallops RESP: Patient is slightly tachypneic, breath sounds clear and equal bilaterally; no wheezes, no rhonchi, no rales, no hypoxia or respiratory distress, speaking full sentences ABD/GI: Normal bowel sounds; non-distended; soft, non-tender, no rebound, no guarding, no peritoneal signs GU:  Normal external genitalia, circumcised male, normal penile shaft, no blood or discharge at the urethral meatus, right testicle appears normal and is nontender to palpation.  Left testicle is high riding and very tender to palpation with lack of cremasteric reflex.  No scrotal swelling, redness or warmth.  No perineal ecchymosis, erythema, warmth, crepitus. BACK: The back appears normal EXT: Normal ROM in all joints; no deformity noted, no edema; no cyanosis SKIN: Normal color for age and race; warm; no rash on exposed skin NEURO: Moves all extremities equally, normal speech PSYCH: The patient's mood and manner are appropriate.   ED Results / Procedures / Treatments   LABS: (all labs ordered are listed, but only abnormal results are displayed) Labs Reviewed  CHLAMYDIA/NGC RT PCR ([facility] ONLY)            CBC WITH DIFFERENTIAL/PLATELET  BASIC METABOLIC  PANEL  URINALYSIS, ROUTINE W REFLEX MICROSCOPIC  PROTIME-INR  TYPE AND SCREEN     EKG:  RADIOLOGY: My personal review and interpretation of imaging: Ultrasound shows left testicular torsion.  I have personally reviewed all radiology reports.   US SCROTUM W/DOPPLER  Result Date: 07/17/2022 CLINICAL DATA:  Testicular pain on the left EXAM: SCROTAL ULTRASOUND DOPPLER ULTRASOUND OF THE TESTICLES TECHNIQUE: Complete ultrasound examination of the testicles, epididymis, and other scrotal structures was performed. Color and spectral Doppler ultrasound were also utilized to evaluate blood flow to the testicles. COMPARISON:  Scrotal ultrasound 07/09/2017 FINDINGS: Right testicle Measurements: 4.2 x 2.5 x 2.8 cm. No mass or microlithiasis  visualized. Left testicle Measurements: 4.2 x 3.2 x 2.7 cm. No mass or microlithiasis visualized. Right epididymis:  Normal in size and appearance. Left epididymis:  Normal in size and appearance. Hydrocele:  Small left hydrocele. Varicocele:  None visualized. Pulsed Doppler interrogation of both testes demonstrates no Doppler flow within the left testicle. Normal low resistance arterial and venous waveforms in the right testicle. IMPRESSION: Left testicular torsion. These results were called by telephone at the time of interpretation on 07/17/2022 at 3:29 am to provider [facility] , who verbally acknowledged these results. Electronically Signed   By: Placido Sou M.D.   On: 07/17/2022 03:29     PROCEDURES:  Critical Care performed: Yes, see critical care procedure note(s)   CRITICAL CARE Performed by: Cyril Mourning Aviyana Sonntag   Total critical care time: 35 minutes  Critical care time was exclusive of separately billable procedures and treating other patients.  Critical care was necessary to treat or prevent imminent or life-threatening deterioration.  Critical care was time spent personally by me on the following activities: development of treatment plan with patient and/or surrogate as well as nursing, discussions with consultants, evaluation of patient's response to treatment, examination of patient, obtaining history from patient or surrogate, ordering and performing treatments and interventions, ordering and review of laboratory studies, ordering and review of radiographic studies, pulse oximetry and re-evaluation of patient's condition.   Procedures    IMPRESSION / MDM / ASSESSMENT AND PLAN / ED COURSE  I reviewed the triage vital signs and the nursing notes.    Patient here with sudden onset severe left testicular pain at 10 PM.  The patient is on the cardiac monitor to evaluate for evidence of arrhythmia and/or significant heart rate changes.   DIFFERENTIAL DIAGNOSIS (includes but  not limited to):   Torsion, epididymitis, orchitis, STI, UTI, no sign of scrotal cellulitis or abscess, Fournier's gangrene   Patient's presentation is most consistent with acute presentation with potential threat to life or bodily function.   PLAN: Patient was seen immediately by provider in triage and ultrasound ordered.  Patient was taken immediately to ultrasound before triage was even performed and confirmed with ultrasonographer that patient has a left testicular torsion.  Patient immediately brought back to a treatment room where I was able to interview and examine him and then I immediately called urology for definitive surgical management.  Will obtain CBC, BMP, urinalysis, type and screen, INR.  He has been n.p.o. since about 10 PM.  We will give IV fluids, pain and nausea medicine.  Nursing staff aware as well.  Urology will meet him in the operating room.   MEDICATIONS GIVEN IN ED: Medications  sodium chloride 0.9 % bolus 1,000 mL (1,000 mLs Intravenous New Bag/Given 07/17/22 0335)  ceFAZolin (ANCEF) IVPB 2g/100 mL premix (has no administration in time range)  HYDROmorphone (  DILAUDID) injection 1 mg (1 mg Intravenous Given 07/17/22 0335)  ondansetron (ZOFRAN) injection 4 mg (4 mg Intravenous Given 07/17/22 0335)     ED COURSE:  3:30 AM  OR nurse at bedside for transport to OR.   CONSULTS:  3:18 AM  Discussed case with Dr. Hollice Espy on-call for urology.  Appreciate her help.   OUTSIDE RECORDS REVIEWED: Reviewed patient's last office visit with primary care on 09/17/2013.       FINAL CLINICAL IMPRESSION(S) / ED DIAGNOSES   Final diagnoses:  Left testicular torsion     Rx / DC Orders   ED Discharge Orders     None        Note:  This document was prepared using Dragon voice recognition software and may include unintentional dictation errors.   Aris Moman, Delice Bison, DO 07/17/22 [phone]

## 2022-07-17 NOTE — ED Provider Triage Note (Signed)
Emergency Medicine Provider Triage Evaluation Note  Edwin Acevedo , a 26 y.o. male  was evaluated in triage.  Pt complains of left greater than right testicular pain, sudden onset approximately 10 PM while at rest.  History of "twisting"; no prior surgery for torsion.  Review of Systems  Positive: Testicular pain Negative: Vomiting  Physical Exam  BP (!) 174/110 (BP Location: Left Arm)   Pulse 69   Temp 98.1 F (36.7 C) (Oral)   Resp (!) 22   SpO2 100%  Gen:   Awake, moderate distress   Resp:  Normal effort  MSK:   Moves extremities without difficulty  Other:  Holding testicles  Medical Decision Making  Medically screening exam initiated at 2:14 AM.  Appropriate orders placed.  Edwin Acevedo was informed that the remainder of the evaluation will be completed by another provider, this initial triage assessment does not replace that evaluation, and the importance of remaining in the ED until their evaluation is complete.  26 year old male presenting with sudden onset testicular pain.  He will be taken urgently for ultrasound.  Will obtain basic lab work, UA.   Edwin Blanch, MD 07/17/22 Edwin Acevedo

## 2022-07-17 NOTE — Op Note (Signed)
Date of procedure: 07/17/22  Preoperative diagnosis:  Left testicular torsion  Postoperative diagnosis:  As above  Procedure: Bilateral orchiopexy  Surgeon: Hollice Espy, MD  Anesthesia: General  Complications: None  Intraoperative findings: 180 degree open book procedure performed just prior to induction.  Once the left testicle was delivered, had a light purple hue but proper orientation.  It quickly revascularized.  EBL: Minimal  Specimens: None  Drains: None  Indication: Edwin Acevedo is a 26 y.o. patient with left testicular torsion.  After reviewing the management options for treatment, he elected to proceed with the above surgical procedure(s). We have discussed the potential benefits and risks of the procedure, side effects of the proposed treatment, the likelihood of the patient achieving the goals of the procedure, and any potential problems that might occur during the procedure or recuperation. Informed consent has been obtained.  Description of procedure:  The patient was taken to the operating room and general anesthesia was induced.  The patient was placed in the supine position, prepped and draped in the usual sterile fashion, and preoperative antibiotics were administered. A preoperative time-out was performed.   Just prior to induction, the left testicle had a high ride.  I performed an open book procedure, maneuvering the testicle such that I turned the testicle open laterally 180 degrees and the testicles lie returned back to a normal position.  After induction was complete, a 4 cm long incision was made along the scrotal raphae.  The subcutaneous tissues was opened using Bovie electrocautery.  Attention was deviated to the left side where additional subcutaneous tissues were opened using Bovie.  I then opened the tunica vaginalis on the left and deliver the left testicle into the field.  At a light purple hue but appeared to be in the active revascularization  state with restoration of pinkish blood vessels.  No twist in the cord was noted.  I will wrap the left testicle in a warm saline gauze and move my attention to the right side.  The right testicle was then delivered into the field by opening tunica vaginalis.  I pexed this testicle x3 using 4-0 Prolene suture laterally and inferiorly such that once tied down, the testicle was in the appropriate location within the hemiscrotum in the proper orientation.  Attention was then turned back to the left side.  It continued to look more well vascularized and appeared to be completely salvageable.  The same procedure was performed with Prolene x1 inferiorly and laterally x2.    Finally, the wound was irrigated and dried.  Dartos was closed including the septum using a 4-0 Vicryl suture in a running fashion.  3-0 chromic was used in interrupted fashion for the skin.  Additional Dermabond, scrotal fluffs and scrotal support device were applied.  The patient was then cleaned and dried, repositioned in supine position, reversed from anesthesia, taken the PACU in stable condition.  Plan: Return for wound check in 1 month.  Supportive care.  Hollice Espy, M.D.

## 2022-07-17 NOTE — ED Notes (Signed)
Pt arrived from [place] after [place] tech called with result. MD, Ward made aware.

## 2022-07-17 NOTE — Anesthesia Preprocedure Evaluation (Signed)
Anesthesia Evaluation  Patient identified by MRN, date of birth, ID band Patient awake    Reviewed: Allergy & Precautions, H&P , NPO status , Patient's Chart, lab work & pertinent test results, reviewed documented beta blocker date and time   History of Anesthesia Complications Negative for: history of anesthetic complications  Airway Mallampati: III  TM Distance: >3 FB Neck ROM: full    Dental  (+) Dental Advidsory Given, Teeth Intact   Pulmonary neg shortness of breath, asthma , neg sleep apnea, neg COPD, neg recent URI,    Pulmonary exam normal breath sounds clear to auscultation       Cardiovascular Exercise Tolerance: Good negative cardio ROS Normal cardiovascular exam Rhythm:regular Rate:Normal     Neuro/Psych PSYCHIATRIC DISORDERS Depression negative neurological ROS     GI/Hepatic negative GI ROS, Neg liver ROS,   Endo/Other  negative endocrine ROS  Renal/GU negative Renal ROS  negative genitourinary   Musculoskeletal   Abdominal   Peds  Hematology negative hematology ROS (+)   Anesthesia Other Findings Past Medical History: No date: ADHD No date: Asthma No date: Depression  Patient with history of asthma presenting for emergent surgery secondary to testicular torsion.  Patient last ate solid food around 10pm, so will proceed with RSI.  Reproductive/Obstetrics negative OB ROS                             Anesthesia Physical Anesthesia Plan  ASA: 2 and emergent  Anesthesia Plan: General   Post-op Pain Management:    Induction: Intravenous, Rapid sequence and Cricoid pressure planned  PONV Risk Score and Plan: 2 and Ondansetron, Dexamethasone, Midazolam and Treatment may vary due to age or medical condition  Airway Management Planned: Oral ETT  Additional Equipment:   Intra-op Plan:   Post-operative Plan: Extubation in OR  Informed Consent: I have reviewed the  patients History and Physical, chart, labs and discussed the procedure including the risks, benefits and alternatives for the proposed anesthesia with the patient or authorized representative who has indicated his/her understanding and acceptance.     Dental Advisory Given  Plan Discussed with: Anesthesiologist, CRNA and Surgeon  Anesthesia Plan Comments:         Anesthesia Quick Evaluation

## 2022-07-17 NOTE — Anesthesia Postprocedure Evaluation (Signed)
Anesthesia Post Note  Patient: Edwin Acevedo  Procedure(s) Performed: ORCHIOPEXY ADULT (Bilateral: Scrotum)  Patient location during evaluation: PACU Anesthesia Type: General Level of consciousness: awake and alert Pain management: pain level controlled Vital Signs Assessment: post-procedure vital signs reviewed and stable Respiratory status: spontaneous breathing, nonlabored ventilation, respiratory function stable and patient connected to nasal cannula oxygen Cardiovascular status: blood pressure returned to baseline and stable Postop Assessment: no apparent nausea or vomiting Anesthetic complications: no   No notable events documented.   Last Vitals:  Vitals:   07/17/22 0517 07/17/22 0530  BP: 135/67 137/89  Pulse: 80 70  Resp:    Temp:    SpO2: 98% 100%    Last Pain:  Vitals:   07/17/22 0530  TempSrc:   PainSc: 2                  Martha Clan

## 2022-07-17 NOTE — Anesthesia Procedure Notes (Signed)
Procedure Name: Intubation Date/Time: 07/17/2022 4:09 AM  Performed by: Lendon Colonel, CRNAPre-anesthesia Checklist: Patient identified, Patient being monitored, Timeout performed, Emergency Drugs available and Suction available Patient Re-evaluated:Patient Re-evaluated prior to induction Oxygen Delivery Method: Circle system utilized Preoxygenation: Pre-oxygenation with 100% oxygen Induction Type: IV induction and Rapid sequence Laryngoscope Size: 3 and McGraph Grade View: Grade I Tube type: Oral Tube size: 7.5 mm Number of attempts: 1 Airway Equipment and Method: Stylet Placement Confirmation: ETT inserted through vocal cords under direct vision, positive ETCO2 and breath sounds checked- equal and bilateral Secured at: 22 cm Tube secured with: Tape Dental Injury: Teeth and Oropharynx as per pre-operative assessment

## 2022-07-17 NOTE — ED Notes (Signed)
Pt to OR at this time. Verbal report given at bedside to OR staff present in Flex 54.

## 2022-07-18 ENCOUNTER — Encounter: Payer: Self-pay | Admitting: Urology

## 2022-07-18 ENCOUNTER — Other Ambulatory Visit: Payer: Self-pay

## 2022-08-15 ENCOUNTER — Ambulatory Visit: Payer: Self-pay | Admitting: Urology

## 2022-08-16 ENCOUNTER — Encounter: Payer: Self-pay | Admitting: Urology

## 2022-10-29 ENCOUNTER — Encounter: Payer: Self-pay | Admitting: Urology
